# Patient Record
Sex: Female | Born: 1935 | Race: White | Hispanic: No
Health system: Southern US, Community
[De-identification: ages and names within clinical notes are randomized; demographics above are authoritative.]

---

## 2003-07-10 ENCOUNTER — Other Ambulatory Visit: Payer: Self-pay

## 2004-03-24 ENCOUNTER — Other Ambulatory Visit: Payer: Self-pay

## 2004-03-24 ENCOUNTER — Inpatient Hospital Stay: Payer: Self-pay | Admitting: Orthopaedic Surgery

## 2004-08-28 ENCOUNTER — Emergency Department: Payer: Self-pay | Admitting: Emergency Medicine

## 2004-09-03 ENCOUNTER — Emergency Department: Payer: Self-pay | Admitting: Emergency Medicine

## 2005-09-21 ENCOUNTER — Ambulatory Visit: Payer: Self-pay | Admitting: Specialist

## 2006-02-15 ENCOUNTER — Ambulatory Visit: Payer: Self-pay | Admitting: Specialist

## 2006-03-06 ENCOUNTER — Emergency Department: Payer: Self-pay | Admitting: Emergency Medicine

## 2006-03-08 ENCOUNTER — Ambulatory Visit: Payer: Self-pay | Admitting: Emergency Medicine

## 2006-04-01 ENCOUNTER — Other Ambulatory Visit: Payer: Self-pay

## 2006-04-01 ENCOUNTER — Ambulatory Visit: Payer: Self-pay | Admitting: Specialist

## 2006-04-20 ENCOUNTER — Other Ambulatory Visit: Payer: Self-pay

## 2006-04-21 ENCOUNTER — Other Ambulatory Visit: Payer: Self-pay

## 2006-04-21 ENCOUNTER — Inpatient Hospital Stay: Payer: Self-pay | Admitting: Specialist

## 2006-12-29 ENCOUNTER — Other Ambulatory Visit: Payer: Self-pay

## 2006-12-29 ENCOUNTER — Emergency Department: Payer: Self-pay | Admitting: Emergency Medicine

## 2007-01-06 ENCOUNTER — Ambulatory Visit: Payer: Self-pay | Admitting: Specialist

## 2007-08-16 IMAGING — US ABDOMEN ULTRASOUND
1 series · 17 of 25 positions shown · non-contrast
Comparison: none

REASON FOR EXAM: RUQ Pain
COMMENTS:

[Series 1: abdomen ultrasound · 17 of 49 slices shown]
[im 1/49]
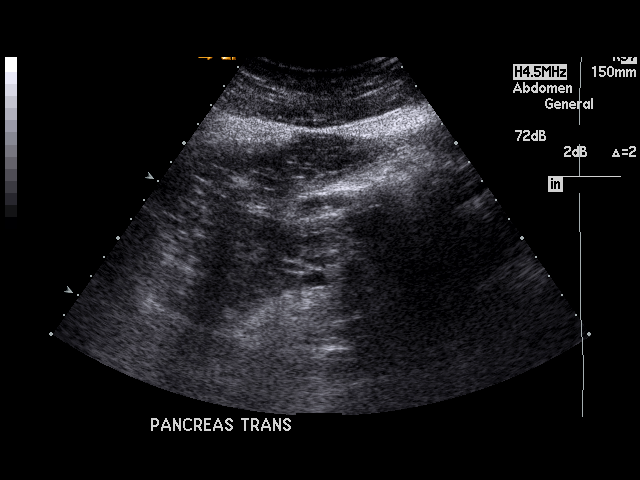
[im 5/49]
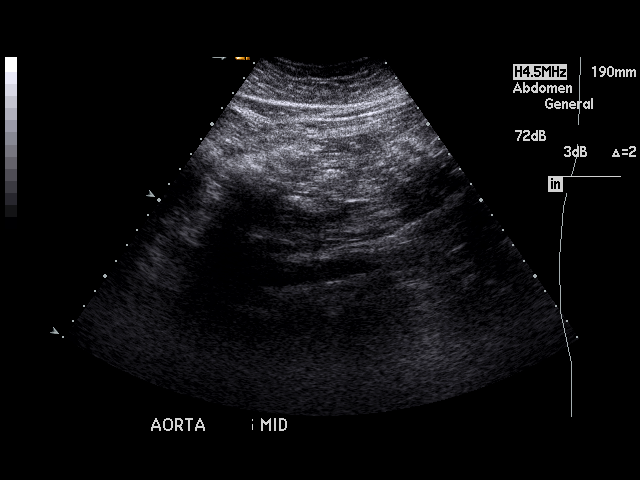
[im 7/49]
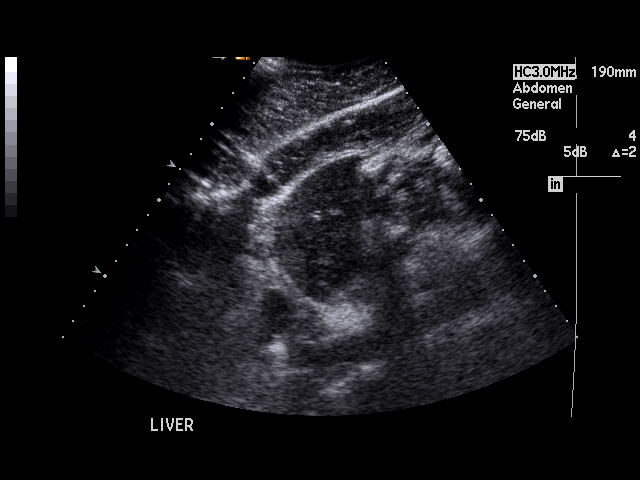
[im 11/49]
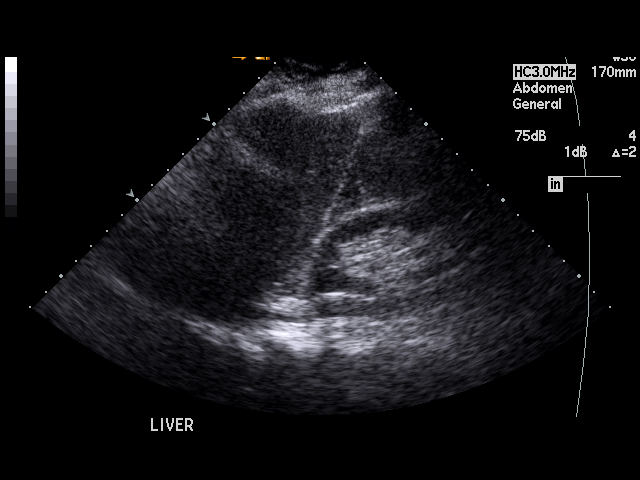
[im 13/49]
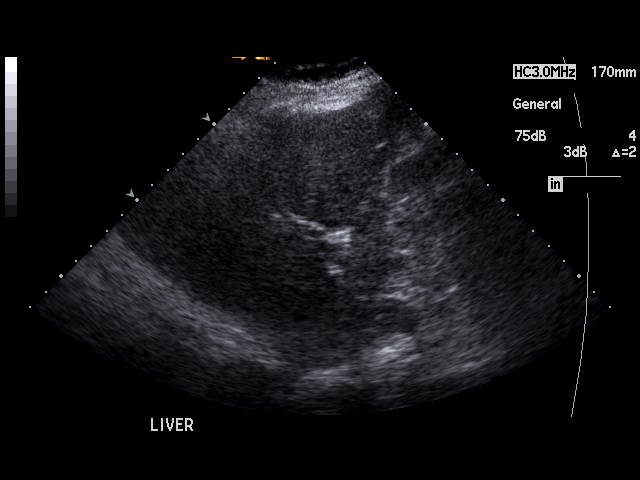
[im 17/49]
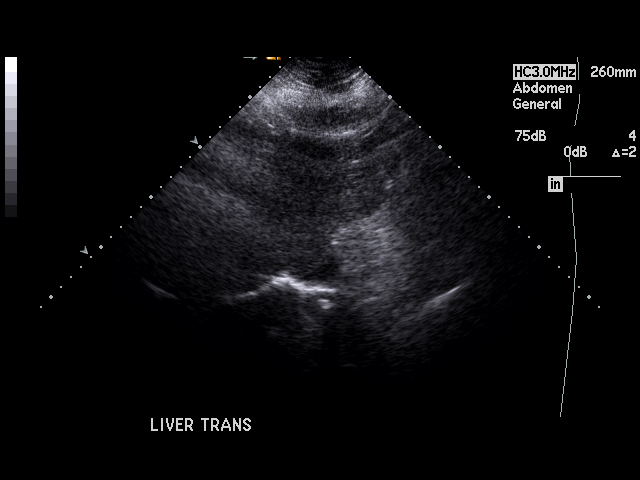
[im 19/49]
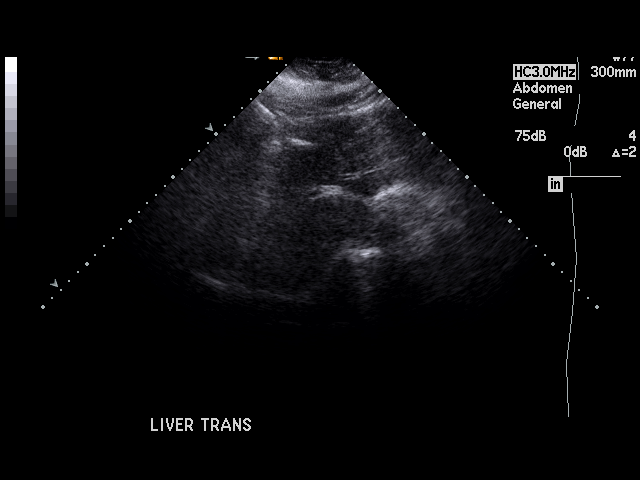
[im 23/49]
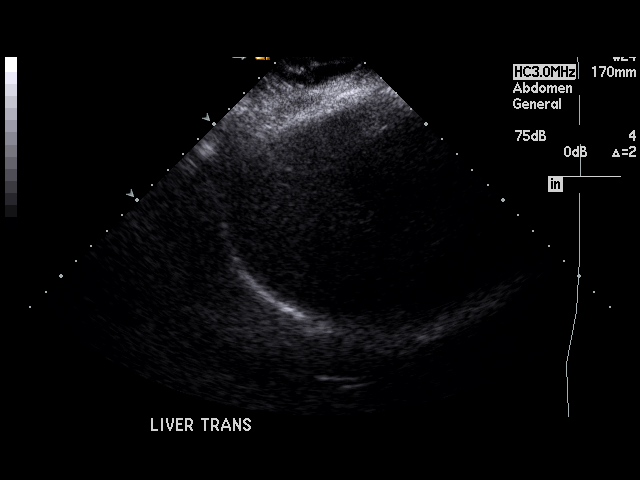
[im 25/49]
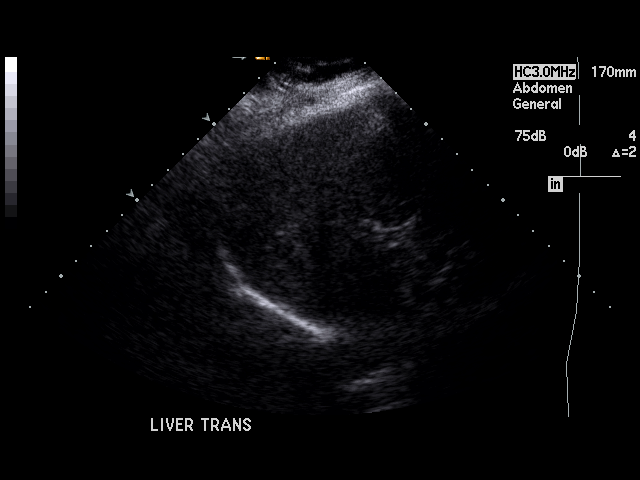
[im 27/49]
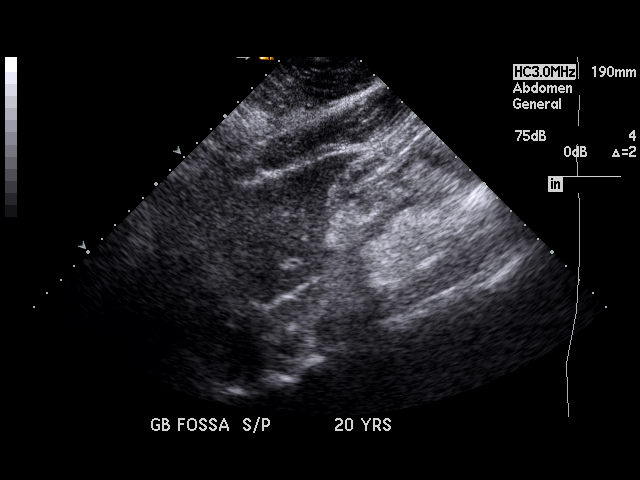
[im 31/49]
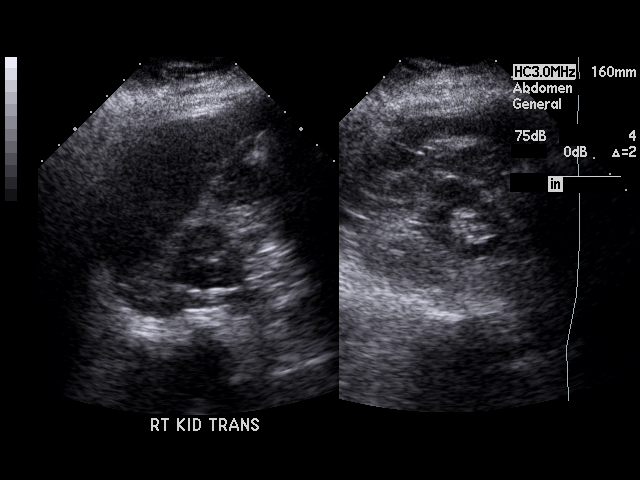
[im 33/49]
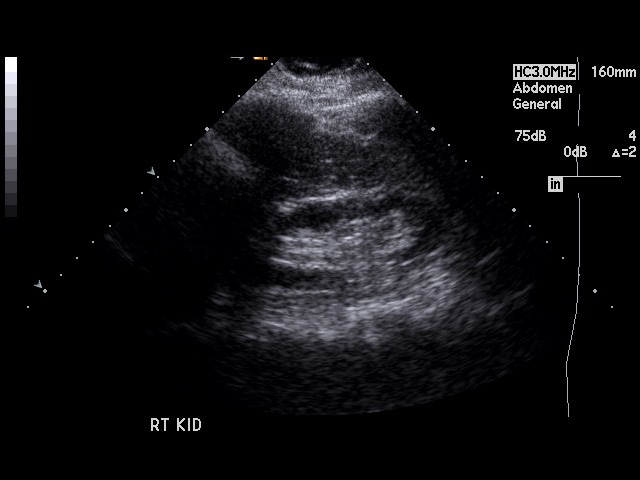
[im 37/49]
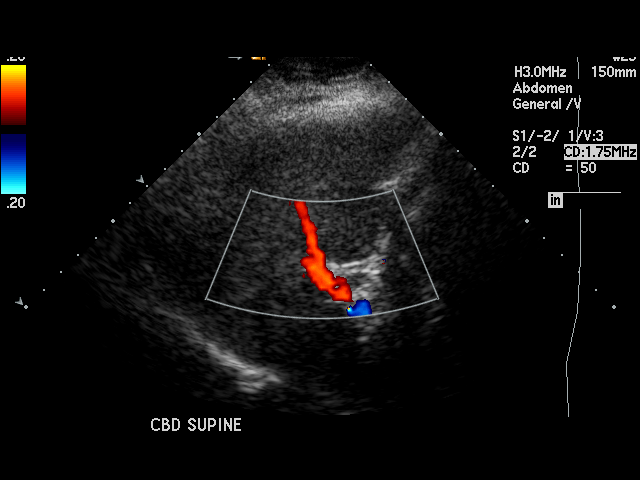
[im 39/49]
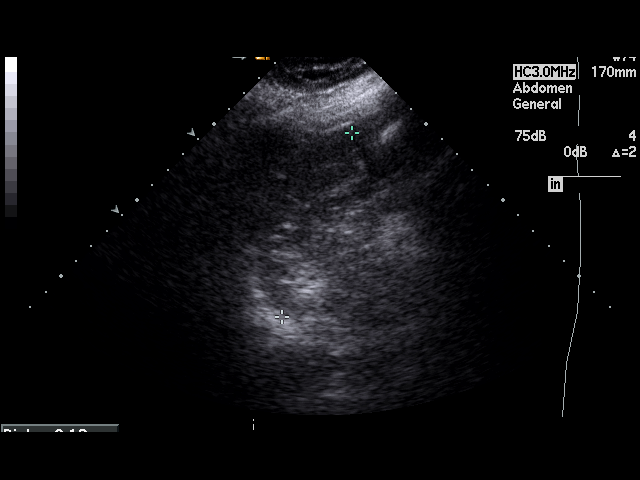
[im 43/49]
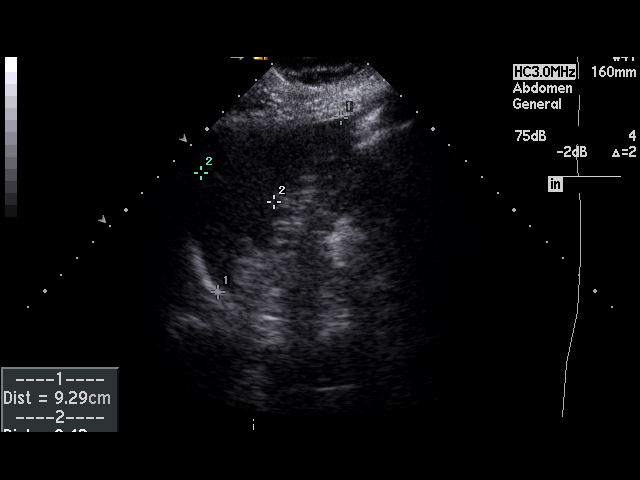
[im 45/49]
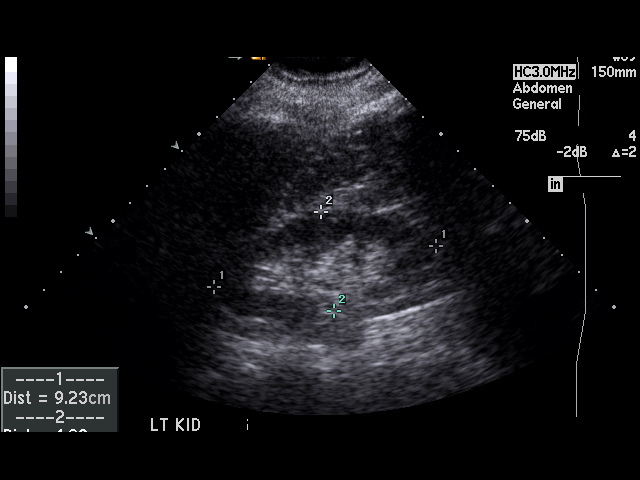
[im 49/49]
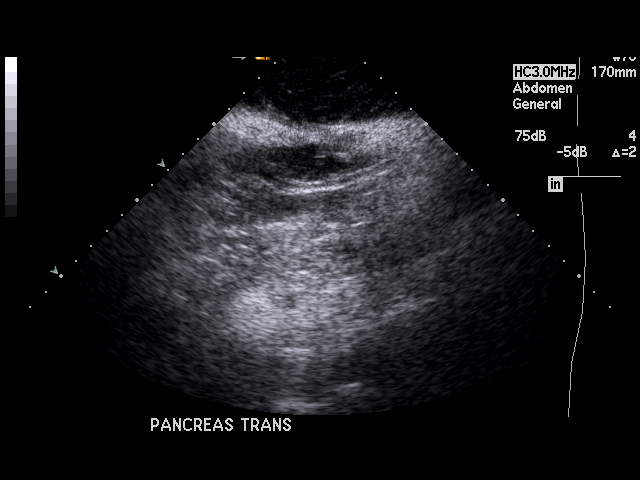

[17 of 25 positions shown; findings below may reference images not displayed]

PROCEDURE:     US  - US ABDOMEN GENERAL SURVEY  - March 08, 2006 [DATE]

RESULT:     The liver, spleen, and abdominal aorta are normal in appearance.
The pancreas is not optimally visualized on this exam.  The patient is
status post cholecystectomy. The common bile duct measures 2.5 mm in
diameter, which is within normal limits. The kidneys show no hydronephrosis.
There is no ascites.
IMPRESSION: 1)No acute changes are identified.

2)The patient is status post cholecystectomy.

3)The pancreas is not visualized adequate for evaluation on this exam.

## 2007-08-31 ENCOUNTER — Emergency Department: Payer: Self-pay | Admitting: Emergency Medicine

## 2007-08-31 ENCOUNTER — Other Ambulatory Visit: Payer: Self-pay

## 2007-09-28 IMAGING — CR DG CHEST 1V PORT
1 series · 1 of 1 positions shown · non-contrast
Comparison: none

REASON FOR EXAM: Altered mental status
COMMENTS:

[view not recorded]
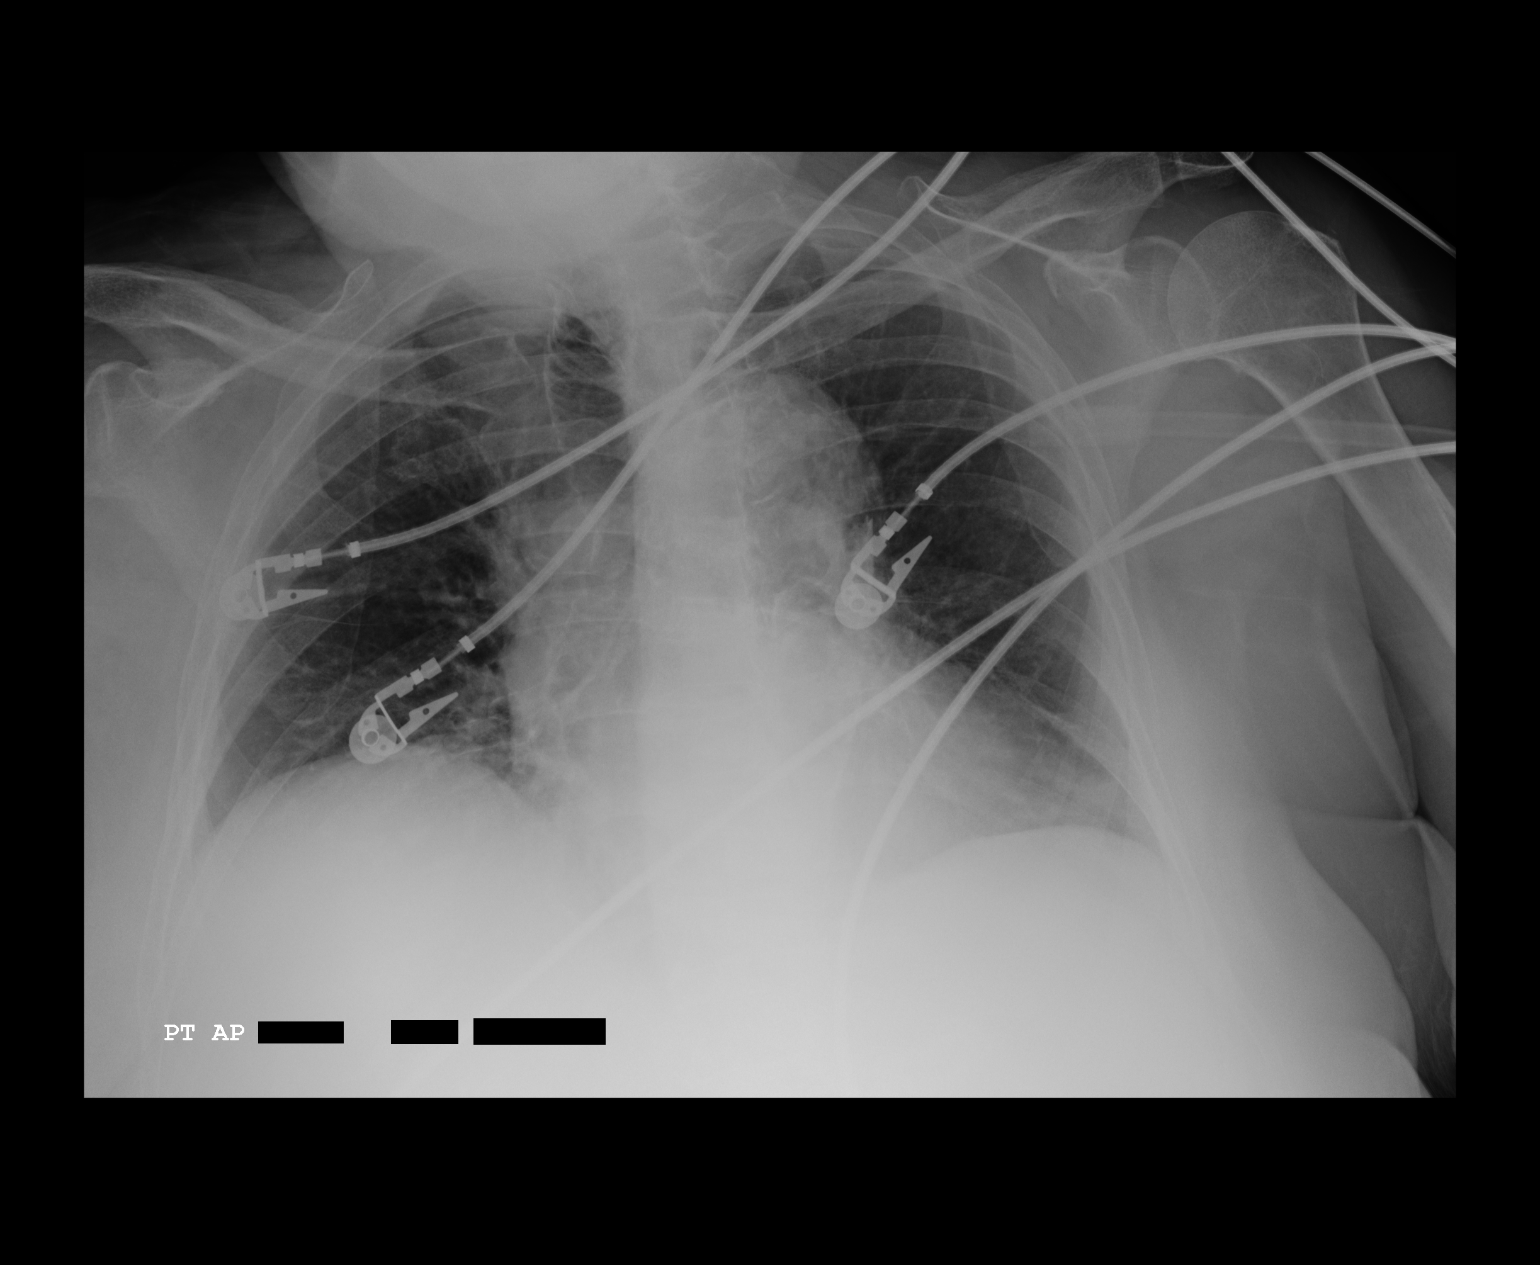

[1 of 1 positions shown; findings below may reference images not displayed]

PROCEDURE:     DXR - DXR PORTABLE CHEST SINGLE VIEW  - April 20, 2006  [DATE]

RESULT:     AP portable exam was obtained and compared to a prior study of
03/06/2006.

The heart is top normal in size. The aortic arch is tortuous and uncoiled.
The lung fields are clear. The vascularity is within normal limits with no
effusions noted.
IMPRESSION: The lung fields are clear.

## 2008-01-19 ENCOUNTER — Other Ambulatory Visit: Payer: Self-pay

## 2008-01-19 ENCOUNTER — Emergency Department: Payer: Self-pay

## 2008-05-05 ENCOUNTER — Ambulatory Visit: Payer: Self-pay | Admitting: Specialist

## 2008-07-09 ENCOUNTER — Inpatient Hospital Stay: Payer: Self-pay | Admitting: Specialist

## 2008-07-20 ENCOUNTER — Ambulatory Visit: Payer: Self-pay | Admitting: Family Medicine

## 2008-08-08 ENCOUNTER — Inpatient Hospital Stay: Payer: Self-pay | Admitting: Internal Medicine

## 2009-02-10 ENCOUNTER — Ambulatory Visit: Payer: Self-pay | Admitting: Geriatric Medicine

## 2009-06-22 ENCOUNTER — Ambulatory Visit: Payer: Self-pay | Admitting: Geriatric Medicine

## 2010-01-10 ENCOUNTER — Ambulatory Visit: Payer: Self-pay | Admitting: Geriatric Medicine

## 2010-01-11 ENCOUNTER — Ambulatory Visit: Payer: Self-pay | Admitting: Geriatric Medicine

## 2010-01-13 ENCOUNTER — Inpatient Hospital Stay: Payer: Self-pay | Admitting: Internal Medicine

## 2010-01-13 ENCOUNTER — Other Ambulatory Visit: Payer: Self-pay

## 2010-07-15 ENCOUNTER — Ambulatory Visit: Payer: Self-pay | Admitting: Ophthalmology

## 2010-07-28 ENCOUNTER — Ambulatory Visit: Payer: Self-pay | Admitting: Ophthalmology

## 2011-06-24 IMAGING — US US RENAL KIDNEY
1 series · 17 of 25 positions shown · non-contrast
Comparison: none

REASON FOR EXAM: acute renal failure, r/o obstruction
COMMENTS:

[Series 1: us renal kidney · 17 of 31 slices shown]
[im 1/31]
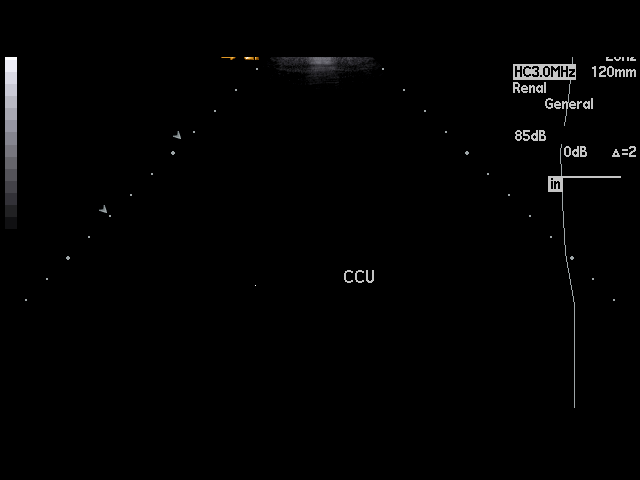
[im 3/31]
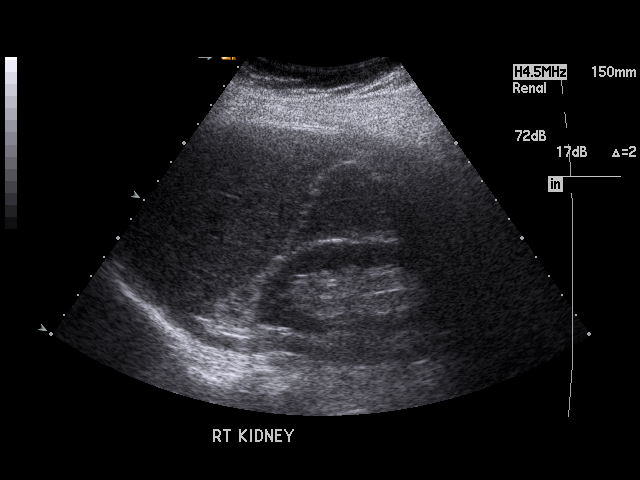
[im 4/31]
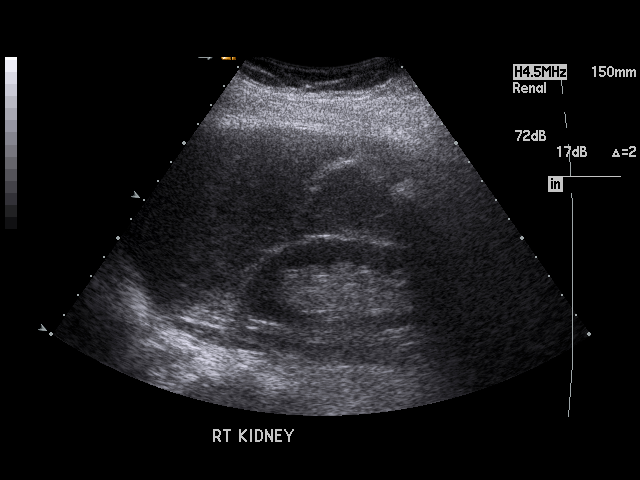
[im 7/31]
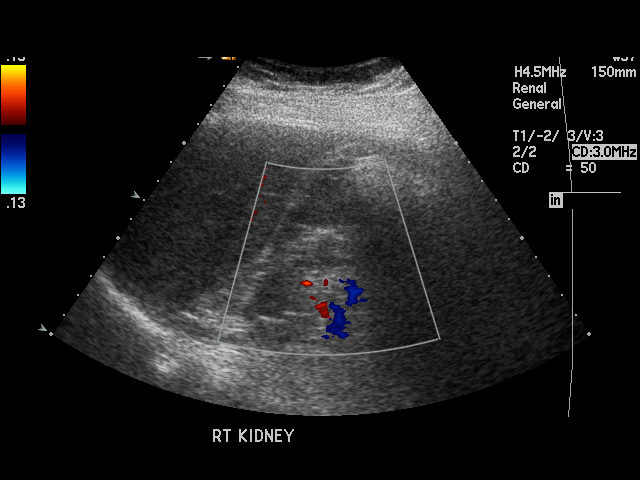
[im 8/31]
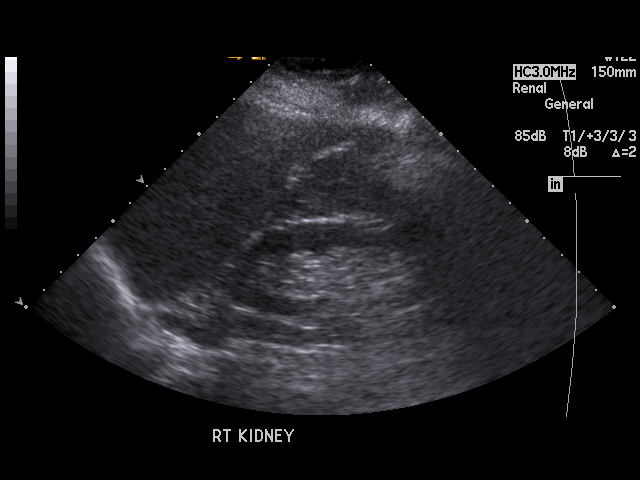
[im 11/31]
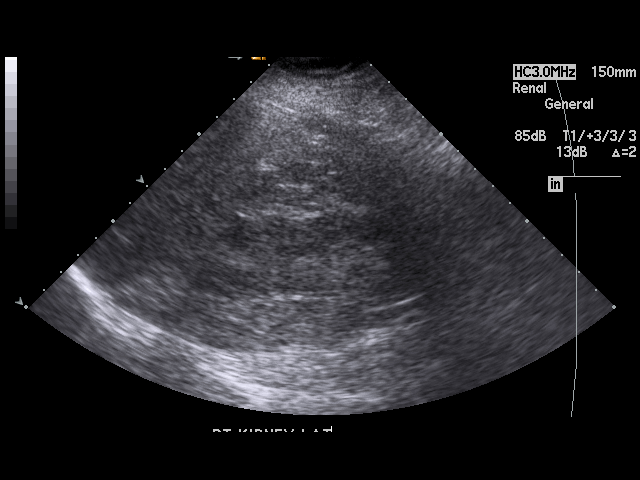
[im 12/31]
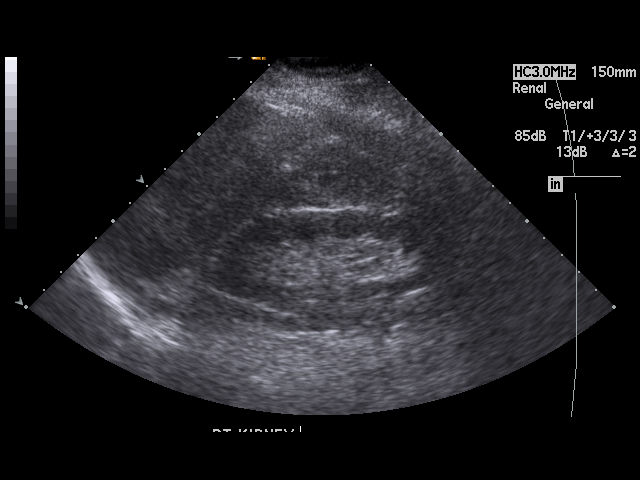
[im 14/31]
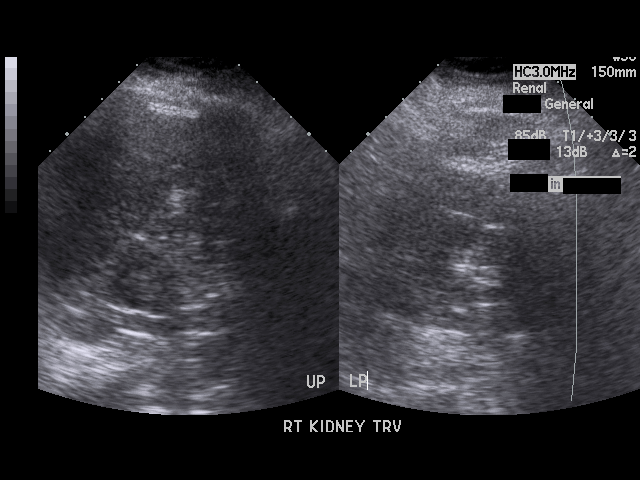
[im 16/31]
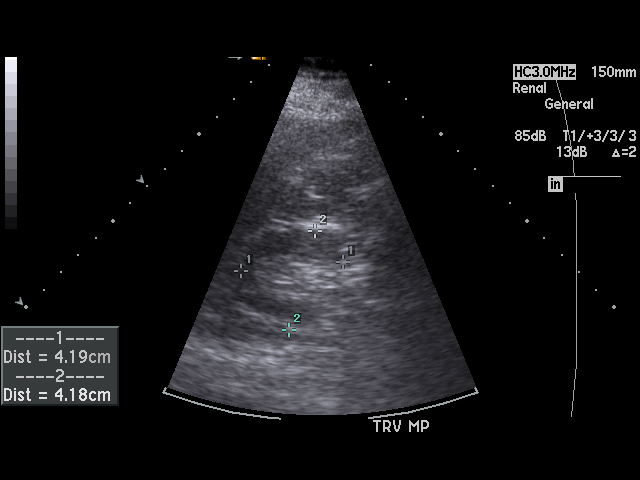
[im 17/31]
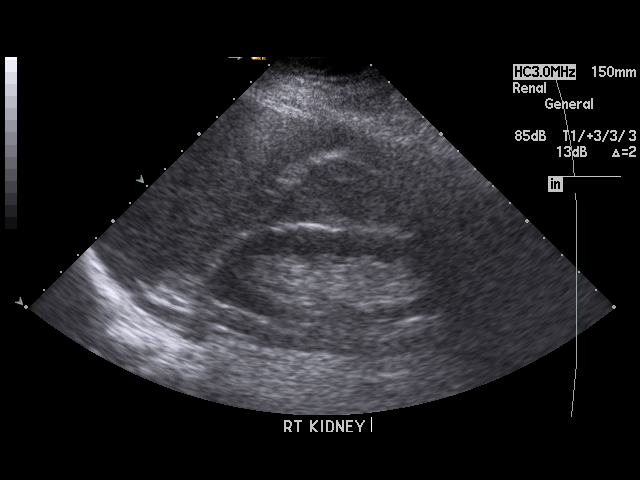
[im 19/31]
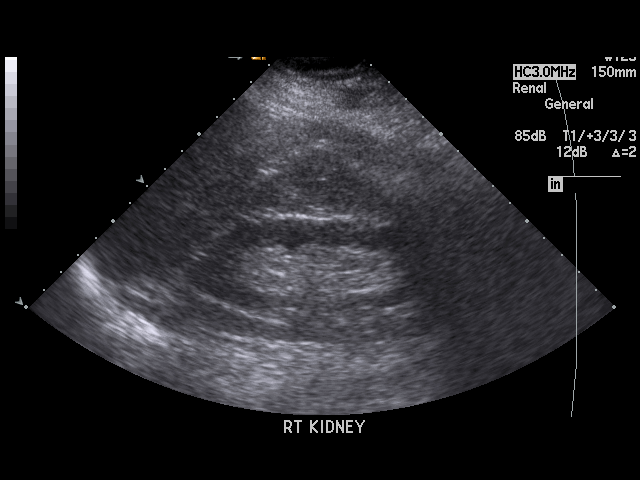
[im 21/31]
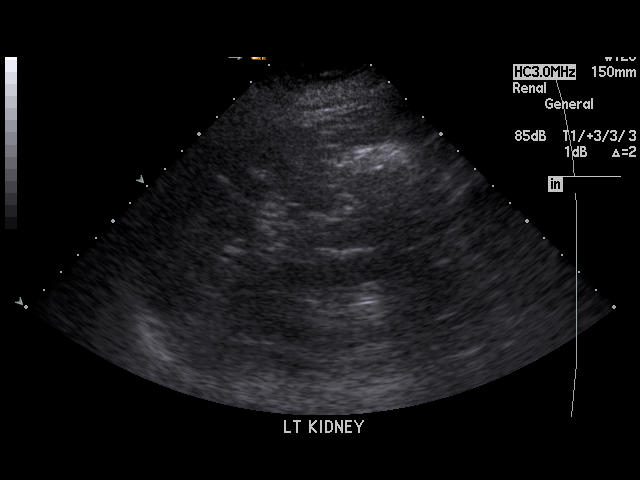
[im 23/31]
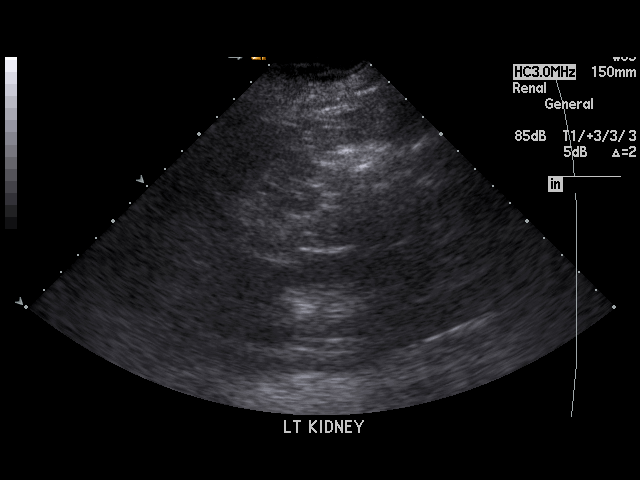
[im 24/31]
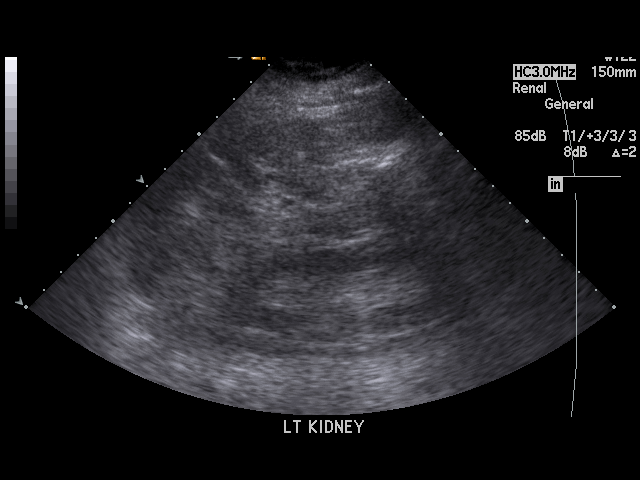
[im 27/31]
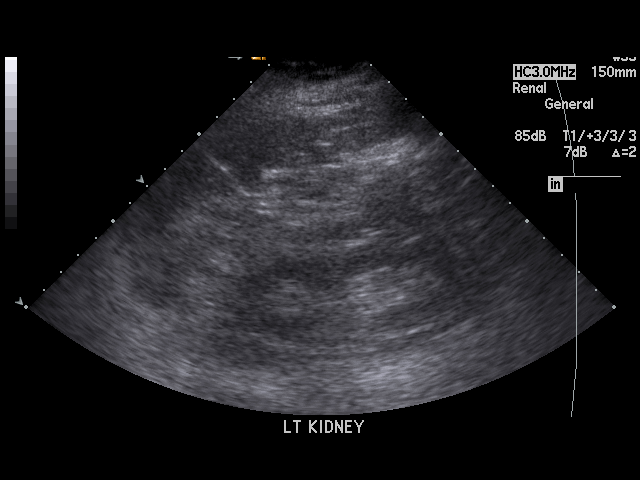
[im 28/31]
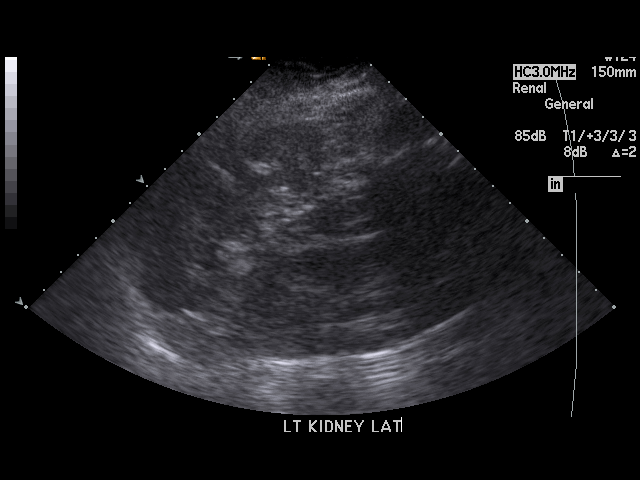
[im 31/31]
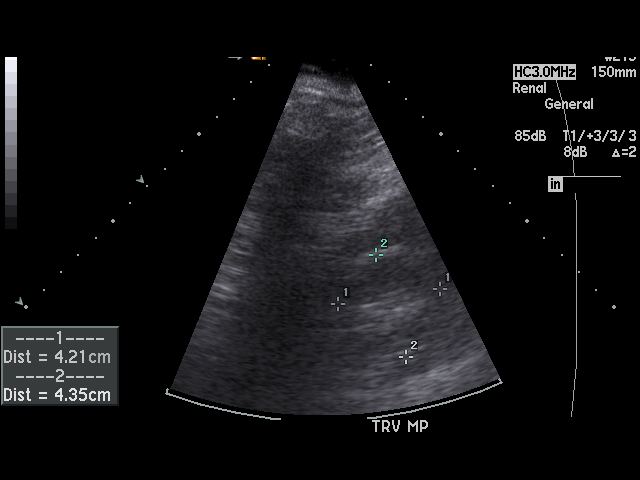

[17 of 25 positions shown; findings below may reference images not displayed]

PROCEDURE:     US  - US KIDNEY  - January 14, 2010  [DATE]

RESULT:     Portable renal sonogram demonstrates the right kidney measures
9.97 x 4.19 x 4.18 cm. The left kidney measures 9.75 x 4.21 x 4.38 cm.
Neither kidney demonstrates a cyst or mass. No obstructive changes are
evident. No stones are appreciated.
IMPRESSION: The urinary bladder was not evaluated as it is decompressed
by a Foley catheter. The kidneys show no gross abnormality.

## 2011-09-13 ENCOUNTER — Other Ambulatory Visit: Payer: Self-pay | Admitting: Geriatric Medicine

## 2011-09-13 LAB — CBC WITH DIFFERENTIAL/PLATELET
Basophil #: 0 10*3/uL (ref 0.0–0.1)
Basophil %: 0.4 %
Eosinophil %: 4.4 %
Lymphocyte #: 1.8 10*3/uL (ref 1.0–3.6)
Lymphocyte %: 23.4 %
MCHC: 33 g/dL (ref 32.0–36.0)
Monocyte %: 7.3 %
Neutrophil #: 5 10*3/uL (ref 1.4–6.5)
RBC: 4.86 10*6/uL (ref 3.80–5.20)
RDW: 15.4 % — ABNORMAL HIGH (ref 11.5–14.5)
WBC: 7.7 10*3/uL (ref 3.6–11.0)

## 2011-09-13 LAB — BASIC METABOLIC PANEL
Anion Gap: 12 (ref 7–16)
Calcium, Total: 8.7 mg/dL (ref 8.5–10.1)
Chloride: 106 mmol/L (ref 98–107)
Creatinine: 0.81 mg/dL (ref 0.60–1.30)
EGFR (African American): >60
EGFR (Non-African Amer.): >60
Osmolality: 281 (ref 275–301)

## 2011-09-13 LAB — URINALYSIS, COMPLETE
Bacteria: NONE SEEN
Bilirubin,UR: NEGATIVE
Glucose,UR: NEGATIVE mg/dL (ref 0–75)
Hyaline Cast: 1
Ketone: NEGATIVE
Nitrite: NEGATIVE
Protein: 30

## 2012-05-07 ENCOUNTER — Other Ambulatory Visit: Payer: Self-pay

## 2012-05-07 LAB — URINALYSIS, COMPLETE
Bacteria: NONE SEEN
Bilirubin,UR: NEGATIVE
Glucose,UR: 50 mg/dL (ref 0–75)
Ketone: NEGATIVE
Leukocyte Esterase: NEGATIVE
Ph: 5 (ref 4.5–8.0)
RBC,UR: 69 /HPF (ref 0–5)
Specific Gravity: 1.02 (ref 1.003–1.030)
Squamous Epithelial: <1

## 2012-05-07 LAB — CBC WITH DIFFERENTIAL/PLATELET
Eosinophil %: 3 %
HCT: 46 % (ref 35.0–47.0)
HGB: 15.3 g/dL (ref 12.0–16.0)
Lymphocyte #: 0.8 10*3/uL — ABNORMAL LOW (ref 1.0–3.6)
Lymphocyte %: 9.2 %
Monocyte #: 0.5 x10 3/mm (ref 0.2–0.9)
Monocyte %: 5.6 %
Neutrophil #: 7.4 10*3/uL — ABNORMAL HIGH (ref 1.4–6.5)
Neutrophil %: 81.3 %
Platelet: 176 10*3/uL (ref 150–440)
RDW: 14.5 % (ref 11.5–14.5)
WBC: 9.1 10*3/uL (ref 3.6–11.0)

## 2012-05-07 LAB — COMPREHENSIVE METABOLIC PANEL
Alkaline Phosphatase: 141 U/L — ABNORMAL HIGH (ref 50–136)
Bilirubin,Total: 0.5 mg/dL (ref 0.2–1.0)
Calcium, Total: 8.6 mg/dL (ref 8.5–10.1)
Co2: 23 mmol/L (ref 21–32)
EGFR (African American): >60
EGFR (Non-African Amer.): >60
Glucose: 129 mg/dL — ABNORMAL HIGH (ref 65–99)
Osmolality: 276 (ref 275–301)
Potassium: 4.1 mmol/L (ref 3.5–5.1)
SGOT(AST): 39 U/L — ABNORMAL HIGH (ref 15–37)
SGPT (ALT): 28 U/L (ref 12–78)
Sodium: 138 mmol/L (ref 136–145)

## 2012-05-08 ENCOUNTER — Other Ambulatory Visit: Payer: Self-pay

## 2012-05-08 LAB — URINE CULTURE

## 2012-07-20 ENCOUNTER — Other Ambulatory Visit: Payer: Self-pay

## 2012-07-20 LAB — URINALYSIS, COMPLETE
Bilirubin,UR: NEGATIVE
Blood: NEGATIVE
Glucose,UR: NEGATIVE mg/dL (ref 0–75)
Leukocyte Esterase: NEGATIVE
Ph: 5 (ref 4.5–8.0)
Protein: NEGATIVE
Specific Gravity: 1.024 (ref 1.003–1.030)
Squamous Epithelial: <1
WBC UR: 1 /HPF (ref 0–5)

## 2012-07-22 LAB — URINE CULTURE

## 2012-11-28 ENCOUNTER — Other Ambulatory Visit: Payer: Self-pay | Admitting: Family Medicine

## 2012-12-02 LAB — EXPECTORATED SPUTUM ASSESSMENT W REFEX TO RESP CULTURE

## 2013-10-18 ENCOUNTER — Other Ambulatory Visit: Payer: Self-pay | Admitting: Family Medicine

## 2013-10-18 LAB — URINALYSIS, COMPLETE
Bilirubin,UR: NEGATIVE
GLUCOSE, UR: NEGATIVE mg/dL (ref 0–75)
Ketone: NEGATIVE
Nitrite: NEGATIVE
PH: 5 (ref 4.5–8.0)
Protein: NEGATIVE
SPECIFIC GRAVITY: 1.017 (ref 1.003–1.030)
Squamous Epithelial: NONE SEEN

## 2013-10-20 LAB — URINE CULTURE

## 2013-10-31 ENCOUNTER — Other Ambulatory Visit: Payer: Self-pay

## 2013-10-31 LAB — URINALYSIS, COMPLETE
BACTERIA: NONE SEEN
BILIRUBIN, UR: NEGATIVE
BLOOD: NEGATIVE
Glucose,UR: NEGATIVE mg/dL (ref 0–75)
KETONE: NEGATIVE
Leukocyte Esterase: NEGATIVE
Nitrite: NEGATIVE
PROTEIN: NEGATIVE
Ph: 5 (ref 4.5–8.0)
RBC,UR: 6 /HPF (ref 0–5)
SPECIFIC GRAVITY: 1.019 (ref 1.003–1.030)
Squamous Epithelial: 1
WBC UR: 2 /HPF (ref 0–5)

## 2013-10-31 LAB — CBC WITH DIFFERENTIAL/PLATELET
BASOS ABS: 0.1 10*3/uL (ref 0.0–0.1)
Basophil %: 1.4 %
EOS ABS: 0.1 10*3/uL (ref 0.0–0.7)
Eosinophil %: 2.6 %
HCT: 45.6 % (ref 35.0–47.0)
HGB: 14.5 g/dL (ref 12.0–16.0)
LYMPHS ABS: 1.5 10*3/uL (ref 1.0–3.6)
LYMPHS PCT: 26.4 %
MCH: 29.4 pg (ref 26.0–34.0)
MCHC: 31.9 g/dL — AB (ref 32.0–36.0)
MCV: 92 fL (ref 80–100)
MONOS PCT: 9.9 %
Monocyte #: 0.6 x10 3/mm (ref 0.2–0.9)
NEUTROS ABS: 3.5 10*3/uL (ref 1.4–6.5)
Neutrophil %: 59.7 %
PLATELETS: 221 10*3/uL (ref 150–440)
RBC: 4.94 10*6/uL (ref 3.80–5.20)
RDW: 13.7 % (ref 11.5–14.5)
WBC: 5.8 10*3/uL (ref 3.6–11.0)

## 2013-10-31 LAB — BASIC METABOLIC PANEL
Anion Gap: 6 — ABNORMAL LOW (ref 7–16)
BUN: 9 mg/dL (ref 7–18)
CO2: 28 mmol/L (ref 21–32)
Calcium, Total: 9.2 mg/dL (ref 8.5–10.1)
Chloride: 104 mmol/L (ref 98–107)
Creatinine: 0.91 mg/dL (ref 0.60–1.30)
EGFR (African American): >60
EGFR (Non-African Amer.): 56 — ABNORMAL LOW
GLUCOSE: 124 mg/dL — AB (ref 65–99)
Osmolality: 276 (ref 275–301)
POTASSIUM: 3.5 mmol/L (ref 3.5–5.1)
SODIUM: 138 mmol/L (ref 136–145)

## 2013-11-01 LAB — URINE CULTURE

## 2014-12-27 ENCOUNTER — Encounter
Admission: RE | Admit: 2014-12-27 | Discharge: 2014-12-27 | Disposition: A | Discharge status: Home / Self Care | Payer: Medicare Other | Source: Skilled Nursing Facility | Attending: Radiology | Admitting: Radiology

## 2014-12-27 DIAGNOSIS — R4182 Altered mental status, unspecified: Secondary | ICD-10-CM | POA: Insufficient documentation

## 2014-12-27 LAB — URINALYSIS COMPLETE WITH MICROSCOPIC (ARMC ONLY)
BILIRUBIN URINE: NEGATIVE
Glucose, UA: NEGATIVE mg/dL
HGB URINE DIPSTICK: NEGATIVE
KETONES UR: NEGATIVE mg/dL
Nitrite: NEGATIVE
Protein, ur: NEGATIVE mg/dL
Specific Gravity, Urine: 1.02 (ref 1.005–1.030)
pH: 5 (ref 5.0–8.0)

## 2014-12-30 LAB — URINE CULTURE

## 2016-08-07 ENCOUNTER — Other Ambulatory Visit: Payer: Self-pay | Admitting: Adult Health Nurse Practitioner

## 2016-08-07 DIAGNOSIS — R1312 Dysphagia, oropharyngeal phase: Secondary | ICD-10-CM

## 2016-08-18 ENCOUNTER — Ambulatory Visit
Admission: RE | Admit: 2016-08-18 | Discharge: 2016-08-18 | Disposition: A | Discharge status: Home / Self Care | Payer: Medicare Other | Source: Ambulatory Visit | Attending: Adult Health Nurse Practitioner | Admitting: Adult Health Nurse Practitioner

## 2016-08-18 DIAGNOSIS — R1312 Dysphagia, oropharyngeal phase: Secondary | ICD-10-CM | POA: Insufficient documentation

## 2016-08-18 DIAGNOSIS — R1310 Dysphagia, unspecified: Secondary | ICD-10-CM | POA: Diagnosis present

## 2016-08-18 NOTE — Therapy (Signed)
[facility] DIAGNOSTIC RADIOLOGY [address] [place], [place], [address] Phone: [phone]   Fax:     Modified Barium Swallow  Patient Details  Name: Sabrina Campbell MRN: [id number] Date of Birth: Oct 24, 1920 No Data Recorded  Encounter Date: August 25, 2016    Subjective: Patient behavior: (alertness, ability to follow instructions, etc.): Chief complaint   Objective:  Radiological Procedure: A videoflouroscopic evaluation of oral-preparatory, reflex initiation, and pharyngeal phases of the swallow was performed; as well as a screening of the upper esophageal phase.  I. POSTURE: II. VIEW: III. COMPENSATORY STRATEGIES: IV. BOLUSES ADMINISTERED:  Thin Liquid:  Nectar-thick Liquid:  Honey-thick Liquid:  Puree:  Mechanical Soft: V. RESULTS OF EVALUATION: A. ORAL PREPARATORY PHASE: (The lips, tongue, and velum are observed for strength and coordination)       **Overall Severity Rating:  B. SWALLOW INITIATION/REFLEX: (The reflex is normal if "triggered" by the time the bolus reached the base of the tongue)  **Overall Severity Rating:  C. PHARYNGEAL PHASE: (Pharyngeal function is normal if the bolus shows rapid, smooth, and continuous transit through the pharynx and there is no pharyngeal residue after the swallow)  **Overall Severity Rating:  D. LARYNGEAL PENETRATION: (Material entering into the laryngeal inlet/vestibule but not aspirated):  "flash" penetration appeared to occur x1 trial of thin liquids   E. ASPIRATION:  NONE F. ESOPHAGEAL PHASE: (Screening of the upper esophagus)  ASSESSMENT: Pt appears to present w/ mild+ oropharyngeal phase dysphagia.   PLAN/RECOMMENDATIONS:  A. Diet: Dysphagia level 1 (puree) w/ Thin liquids VIA CUP - NO STRAWS recommended  B. Swallowing Precautions: strict aspiration precautions; reduce distractions in the environment; Pills given Whole or Crushed in Puree for safer swallowing - check w/ Pharmacy for  crushability of pills  C. Recommended consultation to: Dietitian as indicated  D. Therapy recommendations: f/u St services to determine appropriateness of food consistencies in diet d/t edentulous status (any consideration of upgrade to dysphagia level 2 - minced foods diet?); education and follow through w/ aspiration precautions w/ pt and staff/family  E. Results and recommendations were discussed w/ patient but unsure of level of comprehension d/t Cognitive decline - family or caregiver/Nurse/therapist were not present at study      End of Session - 08/25/2016 1659    Visit Number 1   Number of Visits 1   Date for SLP Re-Evaluation 2016/08/25   SLP Start Time 1300   SLP Stop Time  1400   SLP Time Calculation (min) 60 min   Activity Tolerance Patient tolerated treatment well      No past medical history on file.  No past surgical history on file.  There were no vitals filed for this visit.              Oropharyngeal dysphagia - Plan: DG OP Swallowing Func-Medicare/Speech Path, DG OP Swallowing Func-Medicare/Speech Path      G-Codes - 08/25/16 1701    Functional Assessment Tool Used clinical judgement   Functional Limitations Swallowing   Swallow Current Status (O1308) At least 20 percent but less than 40 percent impaired, limited or restricted   Swallow Goal Status (M5784) At least 20 percent but less than 40 percent impaired, limited or restricted   Swallow Discharge Status [phone]) At least 20 percent but less than 40 percent impaired, limited or restricted          Problem List There are no active problems to display for this patient.     Jerilynn Som, MS,  CCC-SLP Azani Brogdon 08/18/2016, 5:02 PM  [facility] DI[facility]IC [facility]OLOGY [address] [place], [place], [address] Phone: [phone]   Fax:     Name: Sabrina Campbell MRN: [id number] Date of Birth: [birth year]

## 2017-03-12 ENCOUNTER — Other Ambulatory Visit: Payer: Self-pay

## 2017-03-12 ENCOUNTER — Other Ambulatory Visit
Admission: RE | Admit: 2017-03-12 | Discharge: 2017-03-12 | Disposition: A | Discharge status: Home / Self Care | Payer: Medicare Other | Source: Skilled Nursing Facility | Attending: Family Medicine | Admitting: Family Medicine

## 2017-03-15 LAB — SURGICAL PATHOLOGY

## 2017-03-17 LAB — PATHOLOGY

## 2017-04-24 DEATH — deceased
